# Patient Record
Sex: Male | Born: 1970 | Race: Black or African American | Hispanic: No | Smoking: Light tobacco smoker
Health system: Southern US, Community
[De-identification: ages and names within clinical notes are randomized; demographics above are authoritative.]

## PROBLEM LIST (undated history)

## (undated) ENCOUNTER — Ambulatory Visit: Admission: EM | Payer: Self-pay

## (undated) DIAGNOSIS — IMO0001 Reserved for inherently not codable concepts without codable children: Secondary | ICD-10-CM

## (undated) DIAGNOSIS — A630 Anogenital (venereal) warts: Secondary | ICD-10-CM

## (undated) DIAGNOSIS — R030 Elevated blood-pressure reading, without diagnosis of hypertension: Secondary | ICD-10-CM

## (undated) HISTORY — DX: Elevated blood-pressure reading, without diagnosis of hypertension: R03.0

## (undated) HISTORY — PX: HAND SURGERY: SHX662

## (undated) HISTORY — DX: Reserved for inherently not codable concepts without codable children: IMO0001

## (undated) HISTORY — PX: SURGERY SCROTAL / TESTICULAR: SUR1316

## (undated) HISTORY — DX: Anogenital (venereal) warts: A63.0

---

## 2002-09-10 ENCOUNTER — Emergency Department (HOSPITAL_COMMUNITY): Admission: EM | Admit: 2002-09-10 | Discharge: 2002-09-11 | Payer: Self-pay | Admitting: Emergency Medicine

## 2002-09-10 ENCOUNTER — Encounter: Payer: Self-pay | Admitting: Emergency Medicine

## 2002-09-18 ENCOUNTER — Ambulatory Visit (HOSPITAL_BASED_OUTPATIENT_CLINIC_OR_DEPARTMENT_OTHER): Admission: RE | Admit: 2002-09-18 | Discharge: 2002-09-18 | Payer: Self-pay | Admitting: Orthopedic Surgery

## 2002-12-15 ENCOUNTER — Emergency Department (HOSPITAL_COMMUNITY): Admission: EM | Admit: 2002-12-15 | Discharge: 2002-12-16 | Payer: Self-pay | Admitting: Emergency Medicine

## 2002-12-15 ENCOUNTER — Encounter: Payer: Self-pay | Admitting: Emergency Medicine

## 2003-09-09 ENCOUNTER — Emergency Department (HOSPITAL_COMMUNITY): Admission: EM | Admit: 2003-09-09 | Discharge: 2003-09-09 | Payer: Self-pay | Admitting: Emergency Medicine

## 2003-09-18 ENCOUNTER — Emergency Department (HOSPITAL_COMMUNITY): Admission: EM | Admit: 2003-09-18 | Discharge: 2003-09-18 | Payer: Self-pay | Admitting: Emergency Medicine

## 2004-06-20 ENCOUNTER — Emergency Department (HOSPITAL_COMMUNITY): Admission: EM | Admit: 2004-06-20 | Discharge: 2004-06-20 | Payer: Self-pay | Admitting: Family Medicine

## 2007-05-05 ENCOUNTER — Emergency Department (HOSPITAL_COMMUNITY): Admission: EM | Admit: 2007-05-05 | Discharge: 2007-05-06 | Payer: Self-pay | Admitting: Emergency Medicine

## 2007-08-27 ENCOUNTER — Ambulatory Visit: Payer: Self-pay | Admitting: Internal Medicine

## 2007-08-27 DIAGNOSIS — A630 Anogenital (venereal) warts: Secondary | ICD-10-CM

## 2007-11-10 ENCOUNTER — Emergency Department (HOSPITAL_COMMUNITY): Admission: EM | Admit: 2007-11-10 | Discharge: 2007-11-10 | Payer: Self-pay | Admitting: Emergency Medicine

## 2008-01-19 ENCOUNTER — Emergency Department (HOSPITAL_COMMUNITY): Admission: EM | Admit: 2008-01-19 | Discharge: 2008-01-19 | Payer: Self-pay | Admitting: Emergency Medicine

## 2009-03-25 ENCOUNTER — Emergency Department (HOSPITAL_COMMUNITY): Admission: EM | Admit: 2009-03-25 | Discharge: 2009-03-25 | Payer: Self-pay | Admitting: Emergency Medicine

## 2009-05-05 ENCOUNTER — Encounter: Payer: Self-pay | Admitting: Internal Medicine

## 2009-06-25 IMAGING — CR DG ABDOMEN ACUTE W/ 1V CHEST
3 series · 3 of 3 positions shown · non-contrast
Comparison: none

CLINICAL DATA: Abdominal pain.  Cramping.
 ACUTE ABDOMINAL SERIES:

[w chest pa]
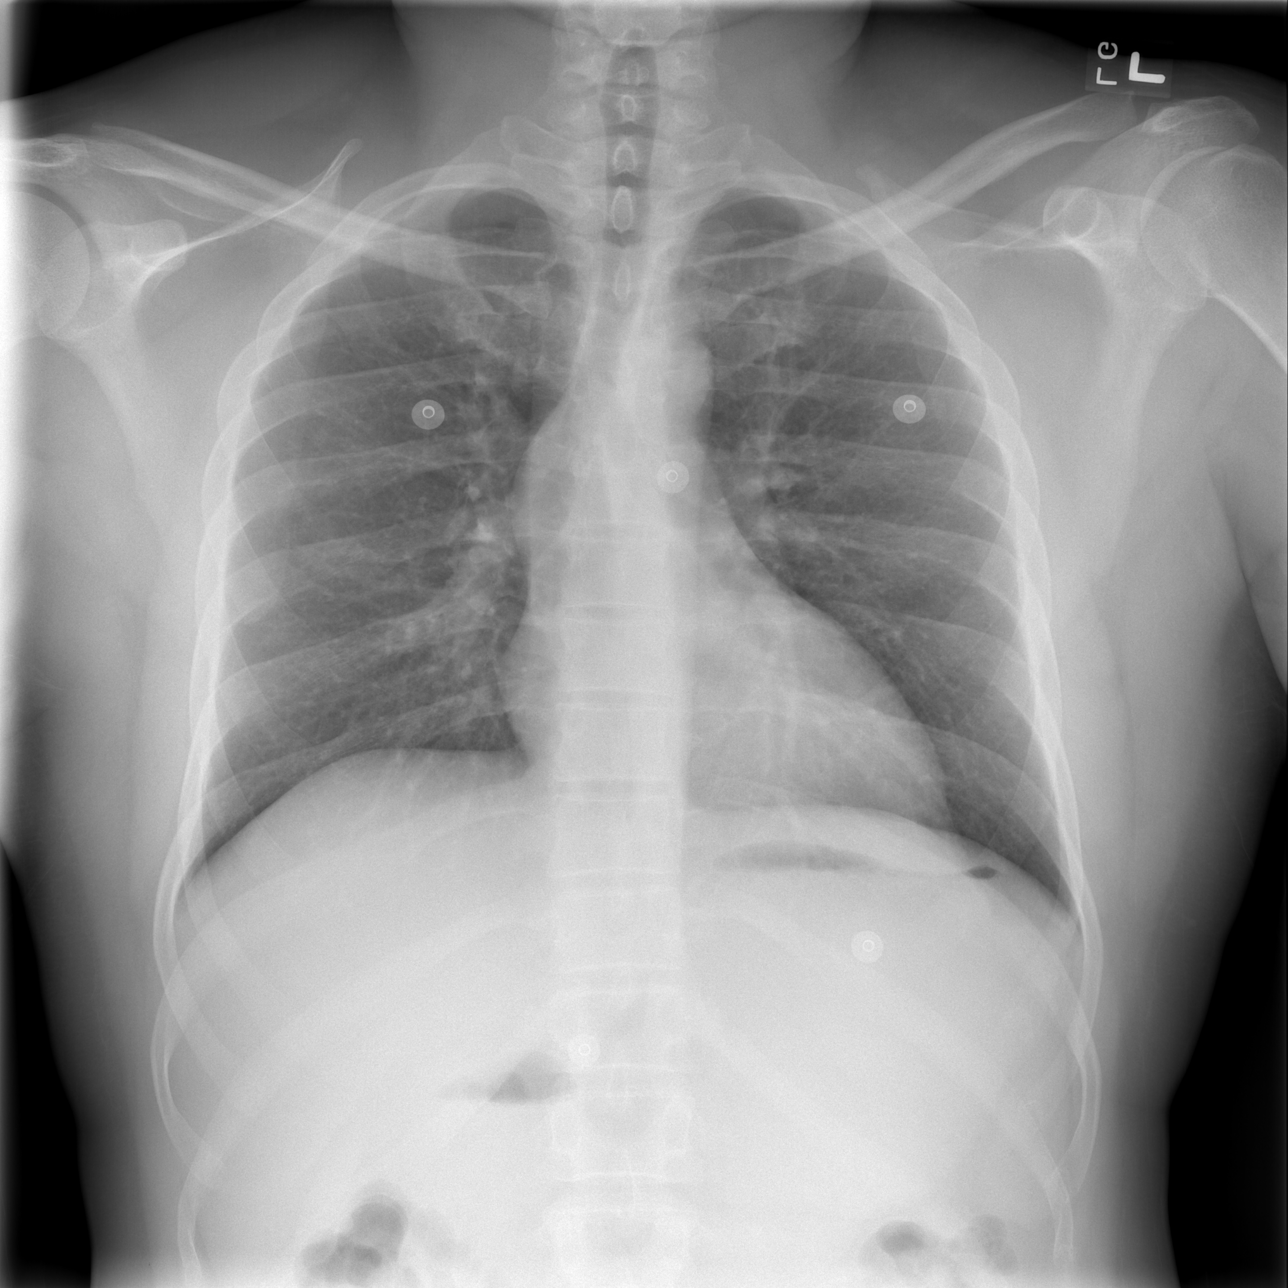

[w abdomen upright *]
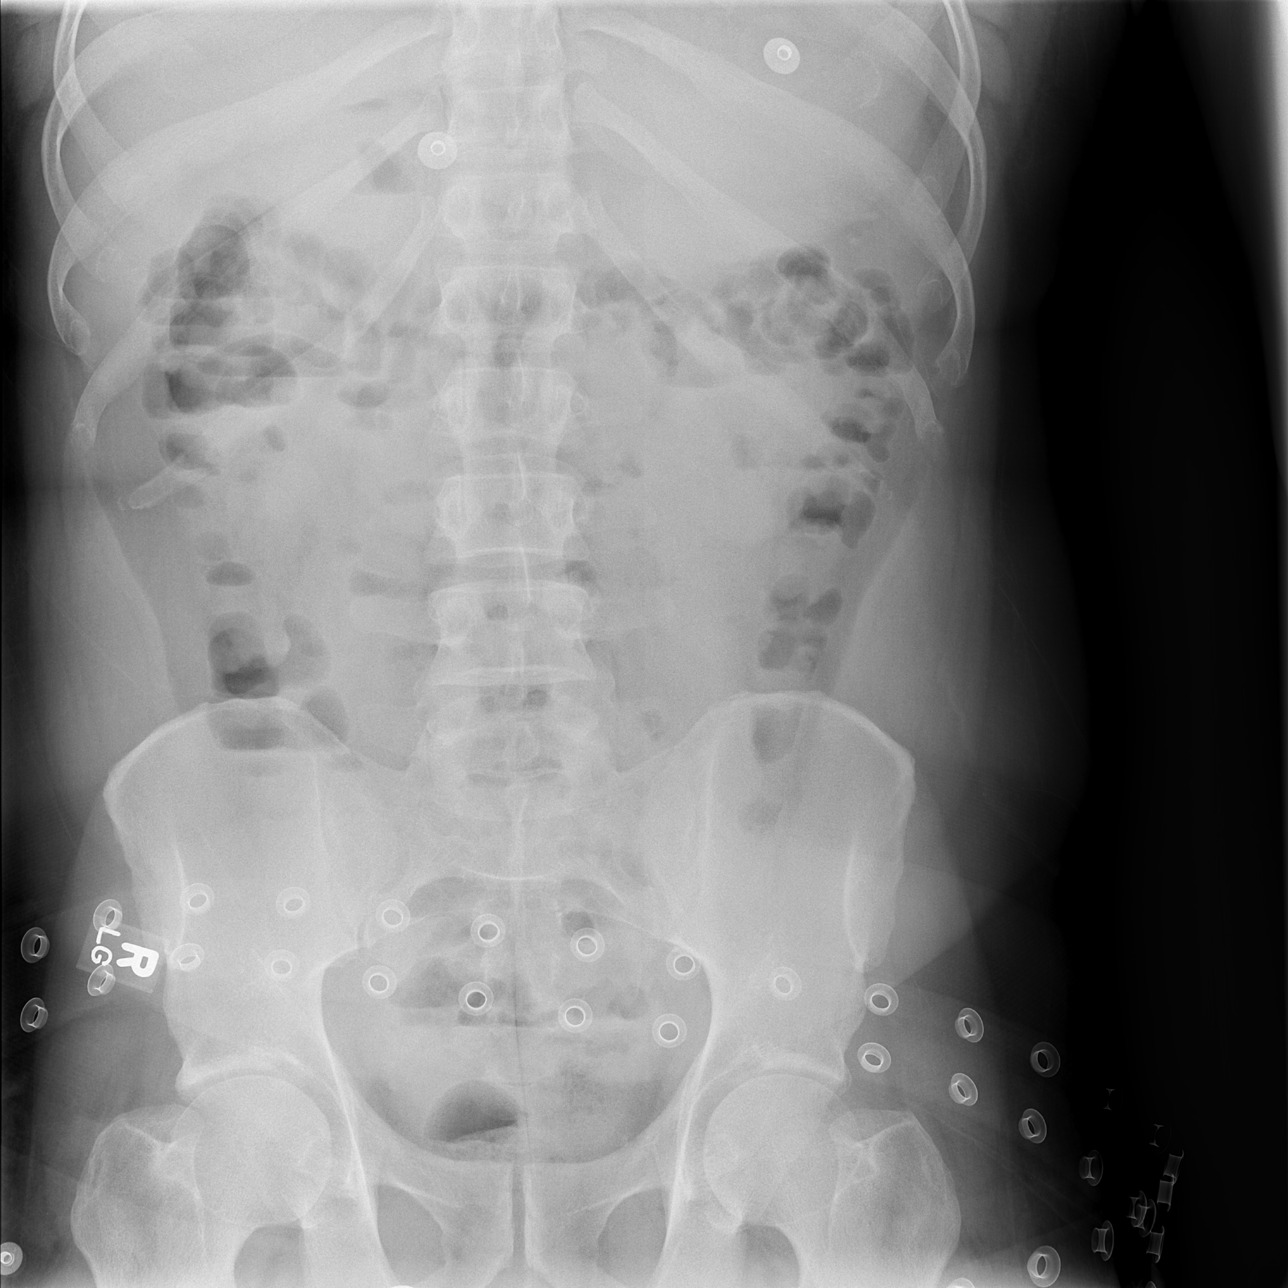

[t abdomen supine]
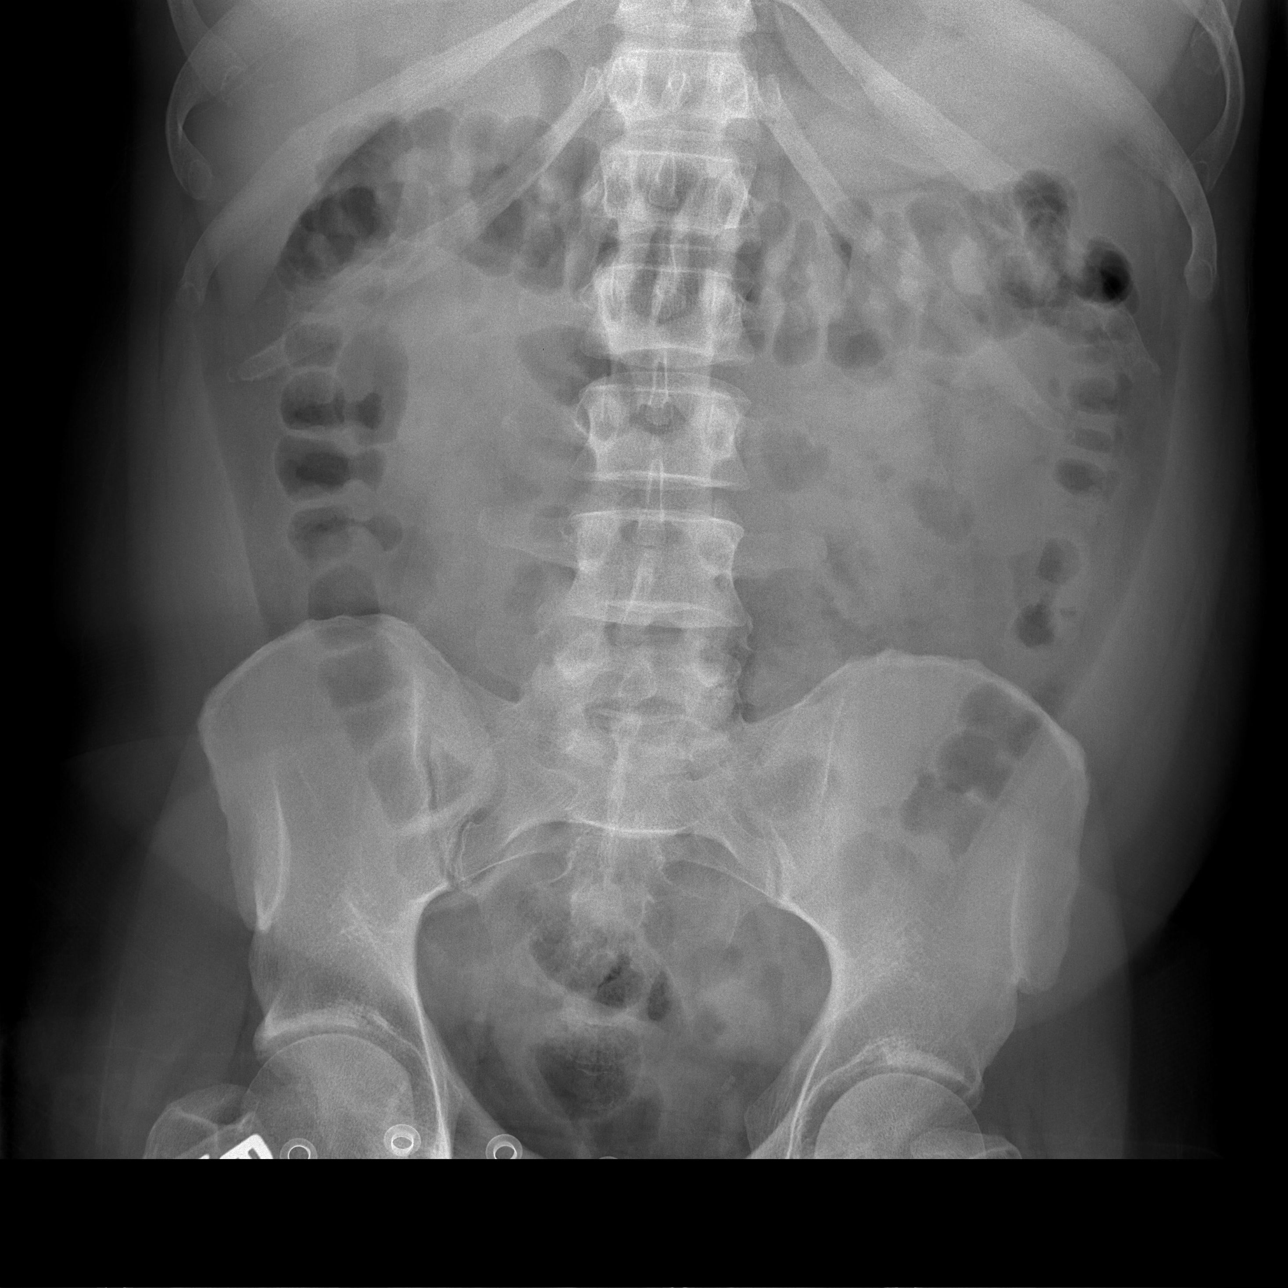

[3 of 3 positions shown; findings below may reference images not displayed]

FINDINGS: Scattered air fluid levels are seen within small bowel and colon.  There is no evidence of dilated bowel loops.  This is a nonspecific, nonobstructive bowel gas pattern.  There is no evidence of free intraperitoneal air.  No radiopaque calculi are identified. 
 Heart size and mediastinal contours are normal.  Both lungs are clear.
IMPRESSION: 1.  Nonspecific, nonobstructive bowel gas pattern.
 2.  No active cardiopulmonary disease.

## 2009-09-20 ENCOUNTER — Encounter: Payer: Self-pay | Admitting: Internal Medicine

## 2009-11-19 ENCOUNTER — Emergency Department (HOSPITAL_COMMUNITY): Admission: EM | Admit: 2009-11-19 | Discharge: 2009-11-19 | Payer: Self-pay | Admitting: Family Medicine

## 2010-04-04 ENCOUNTER — Encounter: Payer: Self-pay | Admitting: Internal Medicine

## 2010-06-21 NOTE — Letter (Signed)
Summary: [facility]  [facility]   Imported By: Maryln Gottron 04/22/2010 11:14:46  _____________________________________________________________________  External Attachment:    Type:   Image     Comment:   External Document

## 2010-06-21 NOTE — Letter (Signed)
Summary: [facility]  [facility]   Imported By: Maryln Gottron 10/19/2009 10:21:14  _____________________________________________________________________  External Attachment:    Type:   Image     Comment:   External Document

## 2010-06-21 NOTE — Consult Note (Signed)
Summary: [facility]  [facility]   Imported By: Maryln Gottron 06/04/2009 10:13:22  _____________________________________________________________________  External Attachment:    Type:   Image     Comment:   External Document

## 2010-07-11 ENCOUNTER — Emergency Department (HOSPITAL_COMMUNITY)
Admission: EM | Admit: 2010-07-11 | Discharge: 2010-07-11 | Disposition: A | Discharge status: Home / Self Care | Payer: BC Managed Care – PPO | Attending: Emergency Medicine | Admitting: Emergency Medicine

## 2010-07-11 ENCOUNTER — Inpatient Hospital Stay (INDEPENDENT_AMBULATORY_CARE_PROVIDER_SITE_OTHER)
Admission: RE | Admit: 2010-07-11 | Discharge: 2010-07-11 | Disposition: A | Discharge status: Home / Self Care | Payer: BC Managed Care – PPO | Source: Ambulatory Visit | Attending: Family Medicine | Admitting: Family Medicine

## 2010-07-11 DIAGNOSIS — H04309 Unspecified dacryocystitis of unspecified lacrimal passage: Secondary | ICD-10-CM | POA: Insufficient documentation

## 2010-07-11 DIAGNOSIS — R51 Headache: Secondary | ICD-10-CM | POA: Insufficient documentation

## 2010-07-11 DIAGNOSIS — R221 Localized swelling, mass and lump, neck: Secondary | ICD-10-CM

## 2010-07-11 DIAGNOSIS — R220 Localized swelling, mass and lump, head: Secondary | ICD-10-CM | POA: Insufficient documentation

## 2010-08-07 LAB — POCT URINALYSIS DIP (DEVICE)
Hgb urine dipstick: NEGATIVE
Ketones, ur: NEGATIVE mg/dL
Protein, ur: 30 mg/dL — AB
Specific Gravity, Urine: >=1.03 (ref 1.005–1.030)
pH: 5.5 (ref 5.0–8.0)

## 2010-10-07 NOTE — Op Note (Signed)
NAME:  Zachary Blake, Zachary Blake NO.:  [id number]   MEDICAL RECORD NO.:  [id number]                   PATIENT TYPE:  AMB   LOCATION:  DSC                                  FACILITY:  [facility]   PHYSICIAN:  Dionne Ano. Everlene Other, M.D.         DATE OF BIRTH:  Oct 13, 1970   DATE OF PROCEDURE:  09/18/2002  DATE OF DISCHARGE:                                 OPERATIVE REPORT   PREOPERATIVE DIAGNOSES:  1. Displaced fourth metacarpal fracture midshaft.  2. Fifth carpal metacarpal intra-articular fracture, right hand.   POSTOPERATIVE DIAGNOSES:  1. Displaced fourth metacarpal fracture midshaft.  2. Fifth carpal metacarpal intra-articular fracture, right hand.   OPERATION:  1. Open reduction and internal fixation with intramedullary fixation fourth     metacarpal fracture, right hand, midshaft in nature.  2. Open reduction and internal fixation fifth CMC fracture, right hand about     the base of the metacarpal.  3. Stress radiography.   SURGEON:  Dionne Ano. Amanda Pea, M.D.   ASSISTANT:  Karie Chimera, P.A.-C.   COMPLICATIONS:  None.   ANESTHESIA:  Regional block with IV sedation.   TOURNIQUET TIME:  Just over an hour.   ESTIMATED BLOOD LOSS:  Minimal.   DRAINS:  None   INDICATIONS FOR PROCEDURE:  This patient is a pleasant 40 year old male who  presents with the above mentioned diagnoses.  I have counseled him in  regards to the risks and benefits of surgery including risks of infection,  bleeding, anesthesia, damage to normal structures and failure of surgery to  accomplish its intended goals of relieving symptoms and restoring function.  With this in mind, he desires to proceed.  All questions have been  encouraged and answered preoperatively.   OPERATIVE FINDINGS:  The patient had significant displacement and  comminution at the fracture sites.  He underwent ORIF as described below  without difficulty.  He was stable at the conclusion of the case.  He  did  have some comminution at the [facility] joint; however, the ulnar pillar was intact  in terms of its stability at the conclusion of the case and I was pleased  with this.  The comminution was primarily about the most radial portion of  the base of the fifth CMC region.   DESCRIPTION OF PROCEDURE:  The patient was seen by myself and anesthesia.  He was taken to the operative suite and underwent smooth induction of IV  sedation and regional block was administered in preoperative holding without  difficulty.  Following this, he was prepped and draped in the usual sterile  fashion.  After which tourniquet was applied.  Once this was done, sterile  field was secured.  Arm was elevated and the tourniquet was inflated to 250  mmHg.  Incision made, oblique in nature, overlying the fourth and fifth CMC  joints.  Dissection was carried down, taking care to preserve the dorsal  sensory branch of the  ulnar nerve.  Dissection was carried down to the  fourth metacarpal base.  A hole was made with 2.5 mm drill and following  this a blunt end 0.062 K-wire was threaded in medullary through the shaft.  The patient had a difficult fracture pattern, thus the decision was made  after attempts at closed reduction to make a small incision over the  fracture site.  This incision was carried out through the transverse knife  blade cut and the incision was carried down bluntly to the extensor  apparatus which was swept out of harm's way.  The fracture site was then  identified and I then reduced the fracture after irrigating it.  This was  reduced and intramedullary fixation device (blunt end K-wire) was passed  through the medullary canal.  This seated nicely.  I pulled it back  somewhat, bent the wire 90 degrees at the base and then engaged it distally.  This had perfect anatomic reduction and I was very pleased with this.  Following this, attention was turned towards the fifth CMC joint.  I opened  up the  capsule.  After blunt dissection down to the fifth CMC joint, the  radial aspect of the fifth metacarpal had a significant comminution and  displaced.  I placed this back into place with elevator and then with  longitudinal traction pinned the fracture site.  The ulnar portion of the  metacarpal looked excellent and sat well against the outer border of the  hamate.  I made sure the pins were dorsal and not volar, so as not to  impinge against the deep motor branch to the ulnar nerve.  The patient  tolerated this well.  Following this, I checked finger splay, which looked  excellent.  Deflated the tourniquet.  Obtained hemostasis and closed the  wounds after stress radiography was performed and x-rays revealed adequate  position of the fractures and hardware.  The patient tolerated this well.  He was awakened from light IV sedation and taken to the recovery room where  he was noted to be in stable condition.  He will be monitored in the  recovery room and then discharged home.  I am going to give him a gram of  additional Ancef in the recovery room and discharged him on Keflex as well  as appropriate pain management.  All questions have been encouraged and  answered.                                               Dionne Ano. Everlene Other, M.D.    Nash Mantis  D:  09/18/2002  T:  09/19/2002  Job:  [id number]

## 2011-02-24 LAB — CBC
HCT: 55 — ABNORMAL HIGH
Hemoglobin: 18.7 — ABNORMAL HIGH
MCHC: 34
MCV: 79.7
Platelets: 229
RDW: 14.7

## 2011-02-24 LAB — COMPREHENSIVE METABOLIC PANEL
BUN: 14
CO2: 23
Calcium: 9.4
Chloride: 104
Creatinine, Ser: 0.98
GFR calc non Af Amer: >60
Glucose, Bld: 135 — ABNORMAL HIGH
Total Bilirubin: 1

## 2011-02-24 LAB — DIFFERENTIAL
Basophils Absolute: 0
Eosinophils Relative: 1
Lymphocytes Relative: 5 — ABNORMAL LOW
Neutro Abs: 15.2 — ABNORMAL HIGH
Neutrophils Relative %: 92 — ABNORMAL HIGH

## 2011-02-24 LAB — LIPASE, BLOOD: Lipase: 20

## 2011-02-27 LAB — URINALYSIS, ROUTINE W REFLEX MICROSCOPIC
Leukocytes, UA: NEGATIVE
Nitrite: NEGATIVE
Specific Gravity, Urine: 1.036 — ABNORMAL HIGH
Urobilinogen, UA: 0.2

## 2011-04-29 ENCOUNTER — Emergency Department (HOSPITAL_COMMUNITY)
Admission: EM | Admit: 2011-04-29 | Discharge: 2011-04-29 | Disposition: A | Discharge status: Home / Self Care | Payer: BC Managed Care – PPO | Source: Home / Self Care | Attending: Family Medicine | Admitting: Family Medicine

## 2011-04-29 ENCOUNTER — Encounter: Payer: Self-pay | Admitting: Emergency Medicine

## 2011-04-29 DIAGNOSIS — S29012A Strain of muscle and tendon of back wall of thorax, initial encounter: Secondary | ICD-10-CM

## 2011-04-29 DIAGNOSIS — S239XXA Sprain of unspecified parts of thorax, initial encounter: Secondary | ICD-10-CM

## 2011-04-29 MED ORDER — KETOROLAC TROMETHAMINE 30 MG/ML IJ SOLN
INTRAMUSCULAR | Status: AC
  Filled 2011-04-29: qty 1

## 2011-04-29 MED ORDER — CYCLOBENZAPRINE HCL 5 MG PO TABS: 5.0000 mg | ORAL_TABLET | Freq: Three times a day (TID) | ORAL | Status: AC | PRN

## 2011-04-29 MED ORDER — KETOROLAC TROMETHAMINE 30 MG/ML IJ SOLN
30.0000 mg | Freq: Once | INTRAMUSCULAR | Status: AC
  Administered 2011-04-29: 30 mg via INTRAMUSCULAR

## 2011-04-29 MED ORDER — IBUPROFEN 800 MG PO TABS: 800.0000 mg | ORAL_TABLET | Freq: Three times a day (TID) | ORAL | Status: AC

## 2011-04-29 NOTE — ED Provider Notes (Signed)
History     CSN: [id number] Arrival date & time: 04/29/2011 10:03 AM   First MD Initiated Contact with Patient 04/29/11 [phone]      Chief Complaint  Patient presents with  . Back Pain    3 weeks ago started having pain in neck and shoulders (on left).  over the 3 weeks has worsened in intensity and is shooting to mid left back, now described as spasms, cramping, worse at night.  denies urinary symptoms.  last bm was last nightand normal per patient .  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      (Consider location/radiation/quality/duration/timing/severity/associated sxs/prior treatment) Patient is a 40 y.o. male presenting with back pain.  Back Pain  This is a new problem. The current episode started more than 1 week ago. The problem occurs constantly. The problem has been gradually improving. The pain is associated with lifting heavy objects. The pain is present in the thoracic spine. The quality of the pain is described as shooting. The pain does not radiate. The pain is mild. The symptoms are aggravated by certain positions (better when child stands on back.). He has tried NSAIDs for the symptoms. The treatment provided mild relief.    History reviewed. No pertinent past medical history.  History reviewed. No pertinent past surgical history.  No family history on file.  History  Substance Use Topics  . Smoking status: Current Everyday Smoker  . Smokeless tobacco: Not on file  . Alcohol Use: Yes      Review of Systems  Constitutional: Negative.   HENT: Negative.   Eyes: Negative.   Respiratory: Negative.   Gastrointestinal: Negative.   Genitourinary: Negative.   Musculoskeletal: Positive for myalgias and back pain.    Allergies  Review of patient's allergies indicates no known allergies.  Home Medications   Current Outpatient Rx  Name Route Sig Dispense Refill  . IBUPROFEN 800 MG PO TABS Oral Take 800 mg by mouth every 8 (eight) hours  as needed.      . CYCLOBENZAPRINE HCL 5 MG PO TABS Oral Take 1 tablet (5 mg total) by mouth 3 (three) times daily as needed for muscle spasms. 30 tablet 0  . IBUPROFEN 800 MG PO TABS Oral Take 1 tablet (800 mg total) by mouth 3 (three) times daily. 30 tablet 1    BP 128/83  Pulse 70  Temp(Src) 97.7 F (36.5 C) (Oral)  Resp 17  SpO2 98%  Physical Exam  Nursing note and vitals reviewed. Constitutional: He appears well-developed and well-nourished.  Pulmonary/Chest: Effort normal and breath sounds normal.  Abdominal: Soft. Bowel sounds are normal.  Musculoskeletal: Normal range of motion. He exhibits tenderness.       Arms: Neurological: He is alert.    ED Course  Procedures (including critical care time)  Labs Reviewed - No data to display No results found.   1. Strain of mid-back       MDM          Barkley Bruns, MD 04/29/11 [phone]

## 2011-04-29 NOTE — ED Notes (Signed)
As above.

## 2011-05-25 ENCOUNTER — Encounter: Payer: Self-pay | Admitting: Internal Medicine

## 2011-05-25 ENCOUNTER — Ambulatory Visit (INDEPENDENT_AMBULATORY_CARE_PROVIDER_SITE_OTHER): Payer: BC Managed Care – PPO | Admitting: Internal Medicine

## 2011-05-25 VITALS — BP 132/92 | HR 87 | Temp 97.7°F | Ht 69.0 in | Wt 204.4 lb

## 2011-05-25 DIAGNOSIS — N5089 Other specified disorders of the male genital organs: Secondary | ICD-10-CM | POA: Insufficient documentation

## 2011-05-25 DIAGNOSIS — R030 Elevated blood-pressure reading, without diagnosis of hypertension: Secondary | ICD-10-CM | POA: Insufficient documentation

## 2011-05-25 DIAGNOSIS — A630 Anogenital (venereal) warts: Secondary | ICD-10-CM

## 2011-05-25 DIAGNOSIS — N508 Other specified disorders of male genital organs: Secondary | ICD-10-CM

## 2011-05-25 MED ORDER — IMIQUIMOD 5 % EX CREA: TOPICAL_CREAM | CUTANEOUS | Status: AC

## 2011-05-25 NOTE — Assessment & Plan Note (Signed)
[employer]

## 2011-05-25 NOTE — Assessment & Plan Note (Addendum)
Discussed low salt diet, exercise. Recheck on return to the office

## 2011-05-25 NOTE — Progress Notes (Signed)
  Subjective:    Patient ID: Zachary Blake, male    DOB: [birth year], 41 y.o.   MRN: [id number]  HPI New patient, and to get established. Has a history of mildly elevated BP, on lifestyle modification. Ambulatory BPs around 130/90. also 1.5 year history of genital warts, at some point saw a physician, was prescribed a medication which helped temporarily. Lesion is back.  Past medical history Elevated BP Genital warts  Past surgical history Left testicular surgery as a child for ? (Undescended testicle?) R hand surgery for Fx   Social history Divorced, 2 children live with him Tobacco-- occasionally ETOH-- socially Job-- Dentist , doors, etc  Family history Diabetes--no CAD--no Stroke--no lung Ca-- GM Colon cancer--no Prostate cancer--no   Review of Systems Denies any dysuria or gross hematuria. Besides warts, no other  genital lesions. No previous history for STDs. he uses a condom consistently    Objective:   Physical Exam  Constitutional: He is oriented to person, place, and time. He appears well-developed and well-nourished.  HENT:  Head: Normocephalic.  Cardiovascular: Normal rate, regular rhythm and normal heart sounds.   No murmur heard. Pulmonary/Chest: Effort normal and breath sounds normal. No respiratory distress. He has no wheezes. He has no rales.  Genitourinary:          Penis is free of discharge or lesions. Scrotum: Left testicle atrophic Right testicle seems normal to palpation, he does have a 1/3 cm soft mass, external from the testicle and posteriorly  Musculoskeletal: He exhibits no edema.  Neurological: He is alert and oriented to person, place, and time.          Assessment & Plan:

## 2011-05-25 NOTE — Patient Instructions (Addendum)
Apply Aldara 3 times a week at bedtime, leave in the skin for 6-10 hours. Use it for 4 months. Check the  blood pressure 2 or 3 times a week, be sure it is less than 140/85. If it is consistently higher, let me know

## 2011-05-25 NOTE — Assessment & Plan Note (Signed)
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 Plan: Ultrasound

## 2011-06-04 ENCOUNTER — Telehealth: Payer: Self-pay | Admitting: Internal Medicine

## 2011-06-04 NOTE — Telephone Encounter (Signed)
HIV RPR ordered, apparently not done. Please schedule labs

## 2011-06-05 NOTE — Telephone Encounter (Signed)
Left message to call office

## 2011-06-06 NOTE — Telephone Encounter (Signed)
Left message to call office

## 2011-06-07 NOTE — Telephone Encounter (Signed)
Left message to call office

## 2011-06-09 ENCOUNTER — Encounter: Payer: Self-pay | Admitting: *Deleted

## 2011-06-09 NOTE — Telephone Encounter (Signed)
Left message to call office, Letter Mail after several attempts to contact Pt. 

## 2011-06-15 ENCOUNTER — Encounter: Payer: Self-pay | Admitting: Internal Medicine

## 2011-06-22 ENCOUNTER — Telehealth: Payer: Self-pay | Admitting: Internal Medicine

## 2011-06-22 NOTE — Telephone Encounter (Signed)
The order for this patient to have a Scrotal u/s was entered on 05-25-11.  [facility] has attempted to contact the patient, I have tried to reach him multiple times, and I mailed him a letter over a week ago.  This patient will not respond to calls or letter.

## 2011-06-22 NOTE — Telephone Encounter (Signed)
I think we are doing all we can. Please send a 2nd and final  letter

## 2011-07-03 ENCOUNTER — Encounter: Payer: Self-pay | Admitting: Internal Medicine

## 2011-07-03 NOTE — Telephone Encounter (Signed)
Final letter mailed to patient.

## 2011-07-05 ENCOUNTER — Encounter: Payer: BC Managed Care – PPO | Admitting: Internal Medicine

## 2011-10-18 ENCOUNTER — Encounter (HOSPITAL_COMMUNITY): Payer: Self-pay

## 2011-10-18 ENCOUNTER — Emergency Department (HOSPITAL_COMMUNITY)
Admission: EM | Admit: 2011-10-18 | Discharge: 2011-10-18 | Disposition: A | Discharge status: Home Health | Payer: BC Managed Care – PPO | Source: Home / Self Care | Attending: Emergency Medicine | Admitting: Emergency Medicine

## 2011-10-18 ENCOUNTER — Emergency Department (INDEPENDENT_AMBULATORY_CARE_PROVIDER_SITE_OTHER): Payer: BC Managed Care – PPO

## 2011-10-18 DIAGNOSIS — S61209A Unspecified open wound of unspecified finger without damage to nail, initial encounter: Secondary | ICD-10-CM

## 2011-10-18 DIAGNOSIS — Z23 Encounter for immunization: Secondary | ICD-10-CM

## 2011-10-18 DIAGNOSIS — S61011A Laceration without foreign body of right thumb without damage to nail, initial encounter: Secondary | ICD-10-CM

## 2011-10-18 MED ORDER — TETANUS-DIPHTH-ACELL PERTUSSIS 5-2.5-18.5 LF-MCG/0.5 IM SUSP
0.5000 mL | Freq: Once | INTRAMUSCULAR | Status: AC
  Administered 2011-10-18: 0.5 mL via INTRAMUSCULAR

## 2011-10-18 MED ORDER — TRAMADOL HCL 50 MG PO TABS: 100.0000 mg | ORAL_TABLET | Freq: Three times a day (TID) | ORAL | Status: AC | PRN

## 2011-10-18 MED ORDER — TETANUS-DIPHTH-ACELL PERTUSSIS 5-2.5-18.5 LF-MCG/0.5 IM SUSP
INTRAMUSCULAR | Status: AC
  Filled 2011-10-18: qty 0.5

## 2011-10-18 NOTE — ED Notes (Signed)
Pt c/o injury to R thumb.  Pt states he shut thumb in car door approx 1 hour ago.  Pt has appox 3cm lac to thumb, bleeding controlled upon arrival.  Tetanus unknown.

## 2011-10-18 NOTE — Discharge Instructions (Signed)

## 2011-10-18 NOTE — ED Provider Notes (Signed)
Chief Complaint  Patient presents with  . Finger Injury    History of Present Illness:  The patient is a 41 year old male who lacerated his right thumb this evening. He shut the thumb up in the sliding door of a van. He cannot recall when his last tetanus shot was. The tip of the thumb is painful. He is able to flex the interphalangeal joint. There is no numbness or tingling.  Review of Systems:  Other than noted above, the patient denies any of the following symptoms: Systemic:  No fever or chills. Musculoskeletal:  No joint pain or decreased range of motion. Neuro:  No numbness, tingling, or weakness.  PMFSH:  Past medical history, family history, social history, meds, and allergies were reviewed.  Physical Exam:   Vital signs:  BP 133/89  Pulse 77  Temp(Src) 98.8 F (37.1 C) (Oral)  Resp 19  SpO2 100% Ext:  There is a 1.5 cm laceration across the tip of the thumb. This is fairly shallow. The tip of the thumb is tender to palpation. There is no obvious deformity. He is able to flex and extend the interphalangeal joint and the MCP joint. Sensation is intact.  All joints had a full ROM without pain.  Pulses were full.  Good capillary refill in all digits.  No edema. Neurological:  Alert and oriented.  No muscle weakness.  Sensation was intact to light touch.   Procedure: Verbal informed consent was obtained.  The patient was informed of the risks and benefits of the procedure and understands and accepts.  Identity of the patient was verified verbally and by wristband.   The laceration area described above was prepped with both a Betadine soak and Betadine prep and anesthetized with a digital block with 5 mL of 2% Xylocaine without epinephrine.  The wound was then closed as follows:  Skin was closed with 5 5-0 nylon sutures.  There were no immediate complications, and the patient tolerated the procedure well. The laceration was then cleansed, Bacitracin ointment was applied and a clean, dry  pressure dressing was put on.   Medications given in UCC:  He was given a Tdap vaccine and tolerated this well without any immediate side effects.  Assessment:  The encounter diagnosis was Laceration of right thumb.  Plan:   1.  The following meds were prescribed:   New Prescriptions   TRAMADOL (ULTRAM) 50 MG TABLET    Take 2 tablets (100 mg total) by mouth every 8 (eight) hours as needed for pain.   2.  The patient was instructed in wound care and pain control, and handouts were given. 3.  The patient was told to return in 14 days for suture removal or wound recheck or sooner if any sign of infection.    Reuben Likes, MD 10/18/11 [phone]

## 2013-09-30 ENCOUNTER — Telehealth: Payer: Self-pay

## 2013-09-30 NOTE — Telephone Encounter (Signed)
Left message for call back Non identifiable  

## 2013-10-01 ENCOUNTER — Ambulatory Visit (INDEPENDENT_AMBULATORY_CARE_PROVIDER_SITE_OTHER): Payer: BC Managed Care – PPO | Admitting: Internal Medicine

## 2013-10-01 ENCOUNTER — Encounter: Payer: Self-pay | Admitting: Internal Medicine

## 2013-10-01 VITALS — BP 125/82 | HR 68 | Temp 98.2°F | Ht 70.0 in | Wt 188.0 lb

## 2013-10-01 DIAGNOSIS — L723 Sebaceous cyst: Secondary | ICD-10-CM

## 2013-10-01 DIAGNOSIS — IMO0002 Reserved for concepts with insufficient information to code with codable children: Secondary | ICD-10-CM

## 2013-10-01 MED ORDER — CYCLOBENZAPRINE HCL 10 MG PO TABS: 10.0000 mg | ORAL_TABLET | Freq: Every evening | ORAL | Status: DC | PRN

## 2013-10-01 NOTE — Progress Notes (Signed)
Pre visit review using our clinic review tool, if applicable. No additional management support is needed unless otherwise documented below in the visit note. 

## 2013-10-01 NOTE — Progress Notes (Signed)
   Subjective:    Patient ID: Zachary Blake, male    DOB: 23-Jan-1971, 43 y.o.   MRN: [id number]  DOS:  10/01/2013 Type of  visit: Here to discuss the following issues Yesterday he was picking up some mail, nothing heavy, he bend and  while he was bending back up developed pain on the left mid thoracic area, no radiation. Has been taking some Advil without much relief. The pain is steady, denies any paresthesias in the chest or abdomen.   Also, 5 months history noted a lump on the left forehead.. Patient quite concerned about it. Denies pain or discharge.   ROS Denies fever, chills. No cough, difficulty breathing or recent prolonged trips. No dysuria or gross hematuria.   Past Medical History  Diagnosis Date  . Elevated BP as a child  . Warts, genital     Past Surgical History  Procedure Laterality Date  . 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 Hand surgery Right     fracture    History   Social History  . Marital Status: Single    Spouse Name: N/A    Number of Children: 2  . Years of Education: N/A   Occupational History  . professional Advertising account plannerinstaler , doors, etc    Social History Main Topics  . Smoking status: Current Every Day Smoker  . Smokeless tobacco: Never Used     Comment: occasionally  . Alcohol Use: Yes     Comment: occasionally  . Drug Use: No  . Sexual Activity: Not on file   Other Topics Concern  . Not on file   Social History Narrative   2 children live w/  him        Medication List       This list is accurate as of: 10/01/13  8:26 PM.  Always use your most recent med list.               cyclobenzaprine 10 MG tablet  Commonly known as:  FLEXERIL  Take 1 tablet (10 mg total) by mouth at bedtime as needed for muscle spasms.           Objective:   Physical Exam  HENT:  Head:     BP 125/82  Pulse 68  Temp(Src) 98.2 F (36.8 C)  Ht 5\' 10"  (1.778 m)  Wt  188 lb (85.276 kg)  BMI 26.98 kg/m2  SpO2 98%  General -- alert, well-developed, NAD.  Lungs -- normal respiratory effort, no intercostal retractions, no accessory muscle use, and normal breath sounds.  Heart-- normal rate, regular rhythm, no murmur.  Skin-- no rash at torso Extremities-- no pretibial edema bilaterally  Back-- no TTP @ the T spine Neurologic--  alert & oriented X3. Speech normal, gait normal, strength normal in all extremities.  DTRs symmetric  Psych-- Cognition and judgment appear intact. Cooperative with normal attention span and concentration. No anxious or depressed appearing.         Assessment & Plan:   Sprain, new issue  Thoracic back sprain, no red flag symptoms, no rash noted today. Conservative treatment with NSAIDs, GI precautions discussed Heating pad Flexeril. If not better he will need to be reassessed  Cyst, new issue  Forehead mass  likely a sebaceous cyst, he does like it removed-- refer to surgery

## 2013-10-01 NOTE — Patient Instructions (Signed)
Motrin 200 mg 2 tablets every 6 hours as needed for pain. Always take it with food because may cause gastritis and ulcers. If you notice nausea, stomach pain, change in the color of stools --->  Stop the medicine and let us know  Flexeril, a muscle relaxant: one time at bedtime as needed, will cause drowsiness  Heating pad  If not improving in the next 2 weeks please let us know.  Come back at your convenience for a physical exam (fasting)

## 2013-10-02 ENCOUNTER — Telehealth: Payer: Self-pay | Admitting: Internal Medicine

## 2013-10-02 NOTE — Telephone Encounter (Signed)
Relevant patient education mailed to patient.  

## 2013-10-03 NOTE — Telephone Encounter (Signed)
Unable to reach prior to visit  

## 2013-10-14 ENCOUNTER — Ambulatory Visit (INDEPENDENT_AMBULATORY_CARE_PROVIDER_SITE_OTHER): Payer: BC Managed Care – PPO | Admitting: Surgery

## 2014-03-22 ENCOUNTER — Emergency Department (HOSPITAL_COMMUNITY)
Admission: EM | Admit: 2014-03-22 | Discharge: 2014-03-22 | Disposition: A | Discharge status: Home / Self Care | Payer: BC Managed Care – PPO | Source: Home / Self Care | Attending: Emergency Medicine | Admitting: Emergency Medicine

## 2014-03-22 ENCOUNTER — Encounter (HOSPITAL_COMMUNITY): Payer: Self-pay | Admitting: Emergency Medicine

## 2014-03-22 DIAGNOSIS — J0120 Acute ethmoidal sinusitis, unspecified: Secondary | ICD-10-CM

## 2014-03-22 MED ORDER — HYDROCODONE-ACETAMINOPHEN 5-325 MG PO TABS: 1.0000 | ORAL_TABLET | Freq: Four times a day (QID) | ORAL | Status: DC | PRN

## 2014-03-22 MED ORDER — AMOXICILLIN-POT CLAVULANATE 875-125 MG PO TABS: 1.0000 | ORAL_TABLET | Freq: Two times a day (BID) | ORAL | Status: DC

## 2014-03-22 NOTE — ED Provider Notes (Signed)
CSN: [id number]     Arrival date & time 03/22/14  1658 History   First MD Initiated Contact with Patient 03/22/14 1710     Chief Complaint  Patient presents with  . Facial Pain   (Consider location/radiation/quality/duration/timing/severity/associated sxs/prior Treatment) HPI He is a 43 year old man here for no swelling. He states that on Tuesday, he fell asleep with his glasses on. Wednesday morning he woke up with pain and swelling on the left nasal bridge. He is having a little more difficulty breathing out of the left nostril.He denies any pain with eye movement, headaches, fevers, chills. It has gradually gotten worse. He took a friend's pain pill last night which did help significantly with the pain.  Past Medical History  Diagnosis Date  . Elevated BP as a child  . Warts, genital    Past Surgical History  Procedure Laterality Date  . 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 Hand surgery Right     fracture   Family History  Problem Relation Age of Onset  . CAD Neg Hx   . Diabetes Neg Hx   . Colon cancer Neg Hx   . Prostate cancer Neg Hx    History  Substance Use Topics  . Smoking status: Light Tobacco Smoker  . Smokeless tobacco: Never Used     Comment: occasionally  . Alcohol Use: Yes     Comment: occasionally    Review of Systems  Constitutional: Negative for fever.  HENT:       Nasal pain and swelling  Eyes: Negative for pain and redness.  Respiratory: Negative for shortness of breath.     Allergies  Review of patient's allergies indicates no known allergies.  Home Medications   Prior to Admission medications   Medication Sig Start Date End Date Taking? Authorizing Provider  amoxicillin-clavulanate (AUGMENTIN) 875-125 MG per tablet Take 1 tablet by mouth 2 (two) times daily. 03/22/14   Charm RingsErin J Catalaya Garr, MD  cyclobenzaprine (FLEXERIL) 10 MG tablet Take 1 tablet (10 mg total) by mouth at bedtime  as needed for muscle spasms. 10/01/13   Wanda PlumpJose E Paz, MD  HYDROcodone-acetaminophen (NORCO) 5-325 MG per tablet Take 1 tablet by mouth every 6 (six) hours as needed for moderate pain. 03/22/14   Charm RingsErin J Marija Calamari, MD   BP 115/77 mmHg  Pulse 92  Temp(Src) 98.5 F (36.9 C) (Oral)  Resp 16  SpO2 99% Physical Exam  Constitutional: He is oriented to person, place, and time. He appears well-developed and well-nourished. No distress.  HENT:  Head: Normocephalic and atraumatic.    Nose: Mucosal edema (on left with nasal polyp) present. No nasal deformity or nasal septal hematoma. Right sinus exhibits no maxillary sinus tenderness and no frontal sinus tenderness. Left sinus exhibits no maxillary sinus tenderness and no frontal sinus tenderness.  Eyes: Conjunctivae and EOM are normal. Right eye exhibits no discharge. Left eye exhibits no discharge.  Neurological: He is alert and oriented to person, place, and time.    ED Course  Procedures (including critical care time) Labs Review Labs Reviewed - No data to display  Imaging Review No results found.   MDM   1. Acute ethmoidal sinusitis, recurrence not specified    Will treat for acute sinusitis with Augmentin for 10 days. Prescription for Norco provided to use as needed for severe pain. Recommended ibuprofen 800 mg 3 times a day for pain and swelling. Follow-up with  PCP if no improvement in 48 hours.    Charm RingsErin J Eman Morimoto, MD 03/22/14 [phone]

## 2014-03-22 NOTE — Discharge Instructions (Signed)
We are going to treat you for a sinus infection. Take Augmentin 2 times a day for 10 days. Take ibuprofen 800mg  every 8 hours as needed. Use norco every 4-6 hours as needed for severe pain.  Do not drive or work while on his medication.  Follow up with PCP if no improvement in 48 hours.

## 2014-03-22 NOTE — ED Notes (Signed)
43 year old male states that about a week ago he fell asleep while wearing his glasses and now he has swelling and  Facial pain across  The bridge of his nose.

## 2015-02-12 ENCOUNTER — Ambulatory Visit (INDEPENDENT_AMBULATORY_CARE_PROVIDER_SITE_OTHER): Payer: 59 | Admitting: Medical

## 2015-02-12 ENCOUNTER — Encounter: Payer: Self-pay | Admitting: Medical

## 2015-02-12 VITALS — BP 120/70 | HR 71 | Temp 97.7°F | Ht 69.0 in | Wt 204.2 lb

## 2015-02-12 DIAGNOSIS — R42 Dizziness and giddiness: Secondary | ICD-10-CM | POA: Diagnosis not present

## 2015-02-12 DIAGNOSIS — R739 Hyperglycemia, unspecified: Secondary | ICD-10-CM | POA: Diagnosis not present

## 2015-02-12 DIAGNOSIS — H6500 Acute serous otitis media, unspecified ear: Secondary | ICD-10-CM | POA: Diagnosis not present

## 2015-02-12 LAB — CBC WITH DIFFERENTIAL/PLATELET
BASOS ABS: 0.1 10*3/uL (ref 0.0–0.1)
Basophils Relative: 1 % (ref 0–1)
Eosinophils Absolute: 0.2 10*3/uL (ref 0.0–0.7)
Eosinophils Relative: 2 % (ref 0–5)
HEMATOCRIT: 46.3 % (ref 39.0–52.0)
HEMOGLOBIN: 16.6 g/dL (ref 13.0–17.0)
LYMPHS ABS: 4.4 10*3/uL — AB (ref 0.7–4.0)
LYMPHS PCT: 40 % (ref 12–46)
MCH: 27.6 pg (ref 26.0–34.0)
MCHC: 35.9 g/dL (ref 30.0–36.0)
MCV: 77 fL — AB (ref 78.0–100.0)
MPV: 9.2 fL (ref 8.6–12.4)
Monocytes Absolute: 0.7 10*3/uL (ref 0.1–1.0)
Monocytes Relative: 6 % (ref 3–12)
NEUTROS PCT: 51 % (ref 43–77)
Neutro Abs: 5.6 10*3/uL (ref 1.7–7.7)
Platelets: 203 10*3/uL (ref 150–400)
RBC: 6.01 MIL/uL — ABNORMAL HIGH (ref 4.22–5.81)
RDW: 15.6 % — ABNORMAL HIGH (ref 11.5–15.5)
WBC: 11 10*3/uL — ABNORMAL HIGH (ref 4.0–10.5)

## 2015-02-12 LAB — COMPREHENSIVE METABOLIC PANEL
ALBUMIN: 4 g/dL (ref 3.6–5.1)
ALT: 20 U/L (ref 9–46)
AST: 19 U/L (ref 10–40)
Alkaline Phosphatase: 58 U/L (ref 40–115)
BUN: 9 mg/dL (ref 7–25)
CALCIUM: 8.9 mg/dL (ref 8.6–10.3)
CHLORIDE: 105 mmol/L (ref 98–110)
CO2: 24 mmol/L (ref 20–31)
Creat: 0.82 mg/dL (ref 0.60–1.35)
Glucose, Bld: 121 mg/dL — ABNORMAL HIGH (ref 65–99)
Potassium: 3.9 mmol/L (ref 3.5–5.3)
SODIUM: 138 mmol/L (ref 135–146)
Total Bilirubin: 0.5 mg/dL (ref 0.2–1.2)
Total Protein: 6.5 g/dL (ref 6.1–8.1)

## 2015-02-12 LAB — HEMOGLOBIN A1C
Hgb A1c MFr Bld: 5.5 % (ref <5.7)
Mean Plasma Glucose: 111 mg/dL (ref <117)

## 2015-02-12 MED ORDER — MECLIZINE HCL 12.5 MG PO TABS: 12.5000 mg | ORAL_TABLET | Freq: Three times a day (TID) | ORAL | Status: DC | PRN

## 2015-02-12 MED ORDER — CEFDINIR 300 MG PO CAPS: 300.0000 mg | ORAL_CAPSULE | Freq: Two times a day (BID) | ORAL | Status: DC

## 2015-02-12 NOTE — Patient Instructions (Addendum)
Your dizziness/vertigo is mild now compared to earlier today with good/normal neuruologic examen absent of worrisome symptoms/signs presently.  You do have moderate bright red left tm that may represent early OM. Ear infection could effect balance. I am going to rx cefdnir antibiotic.  For your dizziness and vertigo I will rx meclizine.   Will do some  work up today for dizziness both cmp and cbc. Finger stick bs in office. No indication for imaging studies but if your symptoms change or worsen as discussed then ED evaluation.  Recommend not doing heavy work or driving if dizzy. If you need to do supervision/not manual  type work that should be ok.  Follow up 7 days with Dr. Drue Novel or as needed.  Will get a1c today as well.

## 2015-02-12 NOTE — Progress Notes (Signed)
Pre visit review using our clinic review tool, if applicable. No additional management support is needed unless otherwise documented below in the visit note. 

## 2015-02-12 NOTE — Addendum Note (Signed)
Addended by: Neldon Labella on: 02/12/2015 03:59 PM   Modules accepted: Orders

## 2015-02-12 NOTE — Progress Notes (Signed)
Subjective:    Patient ID: Zachary Blake, male    DOB: [birth year], 44 y.o.   MRN: [id number]  HPI   Pt in states he is dizzy. Pt stated today. Pt never had before per pt. No uri systems on review. No fever or chills. Started earlier today. This morning was severe started around 10 am. He felt light headed and felt like room spinning. That last about 45 minutes.  Then subsided.He still feels light headed but no spinning. No trauma or head injury. No ha. Pt states he ate has brown at [employer]. Then states he was pouring Chlorox down sink. But no excessive fumes. No vision changes. No gross motor or sensory function deficits.  Pt blood pressure looks good today and good in past as well.   Review of Systems  Constitutional: Negative for fever, chills, diaphoresis, activity change and fatigue.  Respiratory: Negative for cough, chest tightness and shortness of breath.   Cardiovascular: Negative for chest pain, palpitations and leg swelling.  Gastrointestinal: Negative for nausea, vomiting and abdominal pain.  Musculoskeletal: Negative for neck pain and neck stiffness.  Neurological: Negative for dizziness, tremors, seizures, syncope, facial asymmetry, speech difficulty, weakness, light-headedness, numbness and headaches.  Psychiatric/Behavioral: Negative for behavioral problems, confusion and agitation. The patient is not nervous/anxious.     Past Medical History  Diagnosis Date  . Elevated BP as a child  . Warts, genital     Social History   Social History  . Marital Status: Single    Spouse Name: N/A  . Number of Children: 2  . Years of Education: N/A   Occupational History  . professional Advertising account planner , doors, etc    Social History Main Topics  . Smoking status: Light Tobacco Smoker  . Smokeless tobacco: Never Used     Comment: occasionally  . Alcohol Use: Yes     Comment: occasionally  . Drug Use: No  . Sexual Activity: Yes   Other Topics Concern  . Not on file    Social History Narrative   2 children live w/  him    Past Surgical History  Procedure Laterality Date  . 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 Hand surgery Right     fracture    Family History  Problem Relation Age of Onset  . CAD Neg Hx   . Diabetes Neg Hx   . Colon cancer Neg Hx   . Prostate cancer Neg Hx     No Known Allergies  No current outpatient prescriptions on file prior to visit.   No current facility-administered medications on file prior to visit.    BP 120/70 mmHg  Pulse 71  Temp(Src) 97.7 F (36.5 C) (Oral)  Ht  (1.753 m)  Wt 204 lb 3.2 oz (92.625 kg)  BMI 30.14 kg/m2  SpO2 98%       Objective:   Physical Exam  General Mental Status- Alert. General Appearance- Not in acute distress.   Skin General: Color- Normal Color. Moisture- Normal Moisture.  Neck Carotid Arteries- Normal color. Moisture- Normal Moisture. No carotid bruits. No JVD.  Chest and Lung Exam Auscultation: Breath Sounds:-Normal.  Cardiovascular Auscultation:Rythm- Regular. Murmurs & Other Heart Sounds:Auscultation of the heart reveals- No Murmurs.  Abdomen Inspection:-Inspeection Normal. Palpation/Percussion:Note:No mass. Palpation and Percussion of the abdomen reveal- Non Tender, Non Distended + BS, no rebound or guarding.   HEENT Head- Normal. Ear Auditory Canal -  Left- Normal. Right - Normal.Tympanic Membrane- Left- Normal. Right- Normal. Eye Sclera/Conjunctiva- Left- Moderate bright red tm.  Right- Normal. Nose & Sinuses Nasal Mucosa- Left-   Not Boggy and not Congested. Right-  Not  Boggy and  Not Congested.Bilateral maxillary and frontal sinus pressure. Mouth & Throat Lips: Upper Lip- Normal: no dryness, cracking, pallor, cyanosis, or vesicular eruption. Lower Lip-Normal: no dryness, cracking, pallor, cyanosis or vesicular eruption. Buccal Mucosa- Bilateral- No Aphthous  ulcers. Oropharynx- No Discharge or Erythema. Tonsils: Characteristics- Bilateral- No Erythema or Congestion. Size/Enlargement- Bilateral- No enlargement. Discharge- bilateral-None.    Neurologic Cranial Nerve exam:- CN III-XII intact(No nystagmus), symmetric smile. Drift Test:- No drift. Romberg Exam:- Negative.  Heal to Toe Gait exam:-Normal. Finger to Nose:- Normal/Intact Strength:- 5/5 equal and symmetric strength both upper and lower extremities.      Assessment & Plan:  Your dizziness/vertigo is mild now compared to earlier today with good/normal neuruologic examen absent of worrisome symptoms presently.  You do have moderate bright red left tm that may represent early OM. Ear infection could effect balance. I am going to rx cefdnir antibiotic.  For your dizziness and vertigo I will rx meclizine.   Will do some  work up today for dizziness both cmp and cbc. Finger stick bs in office. No indication for imaging studies but if your symptoms change or worsen as discussed then ED evaluation.  Recommend not doing heavy work or driving if dizzy. If you need to do supervision/not manual  type work that should be ok.  Follow up 7 days with Dr. Drue Novel or as needed.

## 2015-02-15 ENCOUNTER — Telehealth: Payer: Self-pay | Admitting: Internal Medicine

## 2015-02-15 NOTE — Telephone Encounter (Signed)
Caller name: Konrad Hoak Relation to pt: Self Call back number:[phone] Pharmacy:  Reason for call: Pt was returning call for lab results. Please advise.

## 2015-02-16 NOTE — Telephone Encounter (Signed)
Spoke with pt and he voices understanding. Pt will call back to schedule an appt.

## 2015-06-18 ENCOUNTER — Telehealth: Payer: Self-pay | Admitting: Internal Medicine

## 2015-06-18 NOTE — Telephone Encounter (Signed)
LM to schedule CPE (new pt was 09/2013) and update info on flu shot

## 2020-12-02 ENCOUNTER — Ambulatory Visit: Admission: EM | Admit: 2020-12-02 | Discharge: 2020-12-02 | Discharge status: Against Medical Advice | Payer: Self-pay

## 2020-12-02 ENCOUNTER — Other Ambulatory Visit: Payer: Self-pay

## 2020-12-02 NOTE — ED Triage Notes (Signed)
Onset this morning of left eye swelling and pain. Confirms discharge. Denies any visual decrease, but notes some photophobia. No right eye sxs. Has been taking tylenol without relief. No uri sxs.

## 2020-12-02 NOTE — ED Notes (Signed)
States he had to leave to go pick up daughter

## 2022-08-14 ENCOUNTER — Ambulatory Visit (INDEPENDENT_AMBULATORY_CARE_PROVIDER_SITE_OTHER): Payer: Commercial Managed Care - PPO | Admitting: Nurse Practitioner

## 2022-08-14 ENCOUNTER — Encounter: Payer: Self-pay | Admitting: Nurse Practitioner

## 2022-08-14 VITALS — BP 134/82 | HR 82 | Temp 98.0°F | Ht 69.0 in | Wt 205.0 lb

## 2022-08-14 DIAGNOSIS — Z23 Encounter for immunization: Secondary | ICD-10-CM | POA: Diagnosis not present

## 2022-08-14 DIAGNOSIS — Z131 Encounter for screening for diabetes mellitus: Secondary | ICD-10-CM

## 2022-08-14 DIAGNOSIS — Z1211 Encounter for screening for malignant neoplasm of colon: Secondary | ICD-10-CM

## 2022-08-14 DIAGNOSIS — Z136 Encounter for screening for cardiovascular disorders: Secondary | ICD-10-CM

## 2022-08-14 DIAGNOSIS — Z0000 Encounter for general adult medical examination without abnormal findings: Secondary | ICD-10-CM

## 2022-08-14 DIAGNOSIS — R030 Elevated blood-pressure reading, without diagnosis of hypertension: Secondary | ICD-10-CM

## 2022-08-14 NOTE — Patient Instructions (Signed)
It was great to see you!  We are checking your labs today and will let you know the results via mychart/phone.   Keep up the great work!   Let's follow-up in 1 year, sooner if you have concerns.  If a referral was placed today, you will be contacted for an appointment. Please note that routine referrals can sometimes take up to 3-4 weeks to process. Please call our office if you haven't heard anything after this time frame.  Take care,  Alastair Hennes, NP  

## 2022-08-14 NOTE — Progress Notes (Unsigned)
New Patient Visit  BP 134/82 (BP Location: Right Arm)   Pulse 82   Temp 98 F (36.7 C)   Ht 5\' 9"  (1.753 m)   Wt 205 lb (93 kg)   SpO2 96%   BMI 30.27 kg/m    Subjective:    Patient ID: Zachary Blake, male    DOB: 22-Dec-1970, 52 y.o.   MRN: [id number]  CC: Chief Complaint  Patient presents with   Establish Care    NP. Est. Care, no concerns    HPI: Zachary Blake is a 52 y.o. male presents for new patient visit to establish care.  Introduced to Designer, jewellery role and practice setting.  All questions answered.  Discussed provider/patient relationship and expectations.  He has a history of elevated blood pressure. He has been trying to control it with diet and exercise. He also stopped drinking 4 months ago. He denies chest pain, shortness of breath, and headaches.   Depression and Anxiety Screen done:     08/14/2022    3:36 PM 02/12/2015    3:04 PM  Depression screen PHQ 2/9  Decreased Interest 0 0  Down, Depressed, Hopeless 0 0  PHQ - 2 Score 0 0  Altered sleeping 0   Tired, decreased energy 1   Change in appetite 0   Feeling bad or failure about yourself  0   Trouble concentrating 0   Moving slowly or fidgety/restless 0   Suicidal thoughts 0   PHQ-9 Score 1   Difficult doing work/chores Not difficult at all       08/14/2022    3:36 PM  GAD 7 : Generalized Anxiety Score  Nervous, Anxious, on Edge 2  Control/stop worrying 3  Worry too much - different things 3  Trouble relaxing 0  Restless 0  Easily annoyed or irritable 1  Afraid - awful might happen 0  Total GAD 7 Score 9  Anxiety Difficulty Not difficult at all    Past Medical History:  Diagnosis Date   Elevated BP as a child   Warts, genital     Past Surgical History:  Procedure Laterality Date   HAND SURGERY Right    fracture   SURGERY SCROTAL / TESTICULAR Left    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   No current facility-administered medications on file prior to visit.     Review of Systems  Constitutional:  Positive for fatigue. Negative for fever.  HENT: Negative.    Eyes: Negative.   Respiratory: Negative.    Cardiovascular: Negative.   Gastrointestinal: Negative.   Genitourinary: Negative.   Musculoskeletal: Negative.   Skin: Negative.   Neurological: Negative.   Psychiatric/Behavioral:         Stress      Objective:    BP 134/82 (BP  Location: Right Arm)   Pulse 82   Temp 98 F (36.7 C)   Ht 5\' 9"  (1.753 m)   Wt 205 lb (93 kg)   SpO2 96%   BMI 30.27 kg/m   Wt Readings from Last 3 Encounters:  08/14/22 205 lb (93 kg)  02/12/15 204 lb 3.2 oz (92.6 kg)  10/01/13 188 lb (85.3 kg)    BP Readings from Last 3 Encounters:  08/14/22 134/82  12/02/20 (!) 145/93  02/12/15 120/70    Physical Exam Vitals and nursing note reviewed.  Constitutional:      General: He is not in acute distress.    Appearance: Normal appearance.  HENT:     Head: Normocephalic and atraumatic.     Right Ear: Tympanic membrane, ear canal and external ear normal.     Left Ear: Tympanic membrane, ear canal and external ear normal.  Eyes:     Conjunctiva/sclera: Conjunctivae normal.  Cardiovascular:     Rate and Rhythm: Normal rate and regular rhythm.     Pulses: Normal pulses.     Heart sounds: Normal heart sounds.  Pulmonary:     Effort: Pulmonary effort is normal.     Breath sounds: Normal breath sounds.  Abdominal:     Palpations: Abdomen is  soft.     Tenderness: There is no abdominal tenderness.  Musculoskeletal:        General: Normal range of motion.     Cervical back: Normal range of motion and neck supple. No tenderness.     Right lower leg: No edema.     Left lower leg: No edema.  Lymphadenopathy:     Cervical: No cervical adenopathy.  Skin:    General: Skin is warm and dry.  Neurological:     General: No focal deficit present.     Mental Status: He is alert and oriented to person, place, and time.     Cranial Nerves: No cranial nerve deficit.     Coordination: Coordination normal.     Gait: Gait normal.  Psychiatric:        Mood and Affect: Mood normal.        Behavior: Behavior normal.        Thought Content: Thought content normal.        Judgment: Judgment normal.       Assessment & Plan:   Problem List Items Addressed This Visit       Other   Elevated blood pressure reading    Blood pressure today 134/82. Continue limiting salt in diet and increasing exercise as able.       Routine general medical examination at a health care facility - Primary    Health maintenance reviewed and updated. Discussed nutrition, exercise. Check CMP, CBC, TSH today. Follow-up 1 year.        Relevant Orders   CBC with Differential/Platelet   Comprehensive metabolic panel   TSH   Other Visit Diagnoses     Screen for colon cancer       Discussed colon cancer screening options and he would like to do the Cologuard. Cologuard ordered today.   Relevant Orders   Cologuard   Immunization due       Td booster given today   Relevant Orders   Td vaccine greater than or equal to 7yo preservative free IM (Completed)   Screening for cardiovascular condition       Screen lipid panel today   Relevant Orders   Lipid panel  Screening for diabetes mellitus       Check A1c today   Relevant Orders   Hemoglobin A1c        Follow up plan: Return in about 1 year (around 08/14/2023) for CPE.

## 2022-08-15 DIAGNOSIS — Z0000 Encounter for general adult medical examination without abnormal findings: Secondary | ICD-10-CM | POA: Insufficient documentation

## 2022-08-15 LAB — CBC WITH DIFFERENTIAL/PLATELET
Basophils Absolute: 0.1 10*3/uL (ref 0.0–0.1)
Basophils Relative: 1.2 % (ref 0.0–3.0)
Eosinophils Absolute: 0.2 10*3/uL (ref 0.0–0.7)
Eosinophils Relative: 1.9 % (ref 0.0–5.0)
HCT: 44 % (ref 39.0–52.0)
Hemoglobin: 14.9 g/dL (ref 13.0–17.0)
Lymphocytes Relative: 48.6 % — ABNORMAL HIGH (ref 12.0–46.0)
Lymphs Abs: 5.1 10*3/uL — ABNORMAL HIGH (ref 0.7–4.0)
MCHC: 33.9 g/dL (ref 30.0–36.0)
MCV: 80.5 fl (ref 78.0–100.0)
Monocytes Absolute: 0.7 10*3/uL (ref 0.1–1.0)
Monocytes Relative: 6.4 % (ref 3.0–12.0)
Neutro Abs: 4.4 10*3/uL (ref 1.4–7.7)
Neutrophils Relative %: 41.9 % — ABNORMAL LOW (ref 43.0–77.0)
Platelets: 254 10*3/uL (ref 150.0–400.0)
RBC: 5.46 Mil/uL (ref 4.22–5.81)
RDW: 14.6 % (ref 11.5–15.5)
WBC: 10.5 10*3/uL (ref 4.0–10.5)

## 2022-08-15 LAB — COMPREHENSIVE METABOLIC PANEL
ALT: 23 U/L (ref 0–53)
AST: 21 U/L (ref 0–37)
Albumin: 4.4 g/dL (ref 3.5–5.2)
Alkaline Phosphatase: 60 U/L (ref 39–117)
BUN: 12 mg/dL (ref 6–23)
CO2: 27 mEq/L (ref 19–32)
Calcium: 9.6 mg/dL (ref 8.4–10.5)
Chloride: 106 mEq/L (ref 96–112)
Creatinine, Ser: 0.95 mg/dL (ref 0.40–1.50)
GFR: 93.69 mL/min (ref >60.00)
Glucose, Bld: 72 mg/dL (ref 70–99)
Potassium: 4.1 mEq/L (ref 3.5–5.1)
Sodium: 141 mEq/L (ref 135–145)
Total Bilirubin: 0.4 mg/dL (ref 0.2–1.2)
Total Protein: 7.2 g/dL (ref 6.0–8.3)

## 2022-08-15 LAB — LIPID PANEL
Cholesterol: 193 mg/dL (ref 0–200)
HDL: 36.8 mg/dL — ABNORMAL LOW (ref >39.00)
LDL Cholesterol: 135 mg/dL — ABNORMAL HIGH (ref 0–99)
NonHDL: 156.12
Total CHOL/HDL Ratio: 5
Triglycerides: 106 mg/dL (ref 0.0–149.0)
VLDL: 21.2 mg/dL (ref 0.0–40.0)

## 2022-08-15 LAB — HEMOGLOBIN A1C: Hgb A1c MFr Bld: 6.1 % (ref 4.6–6.5)

## 2022-08-15 LAB — TSH: TSH: 1.53 u[IU]/mL (ref 0.35–5.50)

## 2022-08-15 NOTE — Assessment & Plan Note (Signed)
Health maintenance reviewed and updated. Discussed nutrition, exercise. Check CMP, CBC, TSH today. Follow-up 1 year.   

## 2022-08-15 NOTE — Assessment & Plan Note (Signed)
Blood pressure today 134/82. Continue limiting salt in diet and increasing exercise as able.

## 2022-08-17 ENCOUNTER — Telehealth: Payer: Self-pay | Admitting: Nurse Practitioner

## 2022-08-17 NOTE — Telephone Encounter (Signed)
Patient notified of test results and  month appointment made.

## 2022-08-17 NOTE — Telephone Encounter (Signed)
Pt called and stated that he is returning your call from 3.27.24 please give him a call back

## 2023-02-01 ENCOUNTER — Ambulatory Visit: Payer: Commercial Managed Care - PPO | Admitting: Nurse Practitioner

## 2023-02-01 ENCOUNTER — Telehealth: Payer: Self-pay | Admitting: Nurse Practitioner

## 2023-02-01 NOTE — Telephone Encounter (Signed)
Pt was a no show for an OV with Lauren on 02/01/23, I sent a letter.

## 2023-02-02 NOTE — Telephone Encounter (Signed)
Noted  

## 2023-02-02 NOTE — Telephone Encounter (Signed)
1st no show, letter sent via mail

## 2023-02-23 ENCOUNTER — Other Ambulatory Visit: Payer: Self-pay | Admitting: Nurse Practitioner

## 2023-02-23 DIAGNOSIS — Z1211 Encounter for screening for malignant neoplasm of colon: Secondary | ICD-10-CM

## 2023-02-23 DIAGNOSIS — Z1212 Encounter for screening for malignant neoplasm of rectum: Secondary | ICD-10-CM

## 2023-05-21 ENCOUNTER — Ambulatory Visit: Payer: Self-pay | Admitting: Nurse Practitioner

## 2023-05-21 NOTE — Telephone Encounter (Signed)
Noted, agree with urgent care since no appointments available.

## 2023-05-21 NOTE — Telephone Encounter (Signed)
Copied from CRM [id number]. Topic: Clinical - Red Word Triage >> May 21, 2023  2:07 PM Alcus Dad H wrote: Red Word that prompted transfer to Nurse Triage: severe abdominal pain, diarrhea, vomiting, low grade fever since this morning    Chief Complaint: abd pain Symptoms: nausea, vomiting, and diarrhea Frequency: intermittent Pertinent Negatives: Patient denies pain with urination Disposition: [] ED /[x] Urgent Care (no appt availability in office) / [] Appointment(In office/virtual)/ []  [facility]/ [] Home Care/ [] Refused Recommended Disposition /[] [facility]/ []  Follow-up with PCP Additional Notes: spoke with patient's mother, sts that patient has had abdominal pain with nausea, vomiting,and diarrhea x 4-5 hours. Started gradually this morning. Pt sts that he ate some pie that he never had before last night. Pain better with laying down, worse with movement. No available appts today, pt encouraged to go to ED.     Reason for Disposition  [1] SEVERE pain AND [2] present < 1 hour  Answer Assessment - Initial Assessment Questions 1. LOCATION: "Where does it hurt?"    Lower belly  2. RADIATION: "Does the pain shoot anywhere else?" (e.g., chest, back)     No  3. ONSET: "When did the pain begin?" (Minutes, hours or days ago)      Approx 4 hours ago  4. SUDDEN: "Gradual or sudden onset?"     Gradual onset this morning  5. PATTERN "Does the pain come and go, or is it constant?"    - If it comes and goes: "How long does it last?" "Do you have pain now?"     (Note: Comes and goes means the pain is intermittent. It goes away completely between bouts.)    - If constant: "Is it getting better, staying the same, or getting worse?"      (Note: Constant means the pain never goes away completely; most serious pain is constant and gets worse.)      Comes and goes, pain usually last 5-10 minutes, gets better when he lays  6. SEVERITY: "How bad is the pain?"  (e.g., Scale 1-10;  mild, moderate, or severe)    - MILD (1-3): Doesn't interfere with normal activities, abdomen soft and not tender to touch.     - MODERATE (4-7): Interferes with normal activities or awakens from sleep, abdomen tender to touch.     - SEVERE (8-10): Excruciating pain, doubled over, unable to do any normal activities.       10- severe  7. RECURRENT SYMPTOM: "Have you ever had this type of stomach pain before?" If Yes, ask: "When was the last time?" and "What happened that time?"      Not a recurrent symptoms  8. CAUSE: "What do you think is causing the stomach pain?"     Unsure, but he did eat pie that he never had before  9. RELIEVING/AGGRAVATING FACTORS: "What makes it better or worse?" (e.g., antacids, bending or twisting motion, bowel movement)     Laying down makes pain better, moving make it worse  10. OTHER SYMPTOMS: "Do you have any other symptoms?" (e.g., back pain, diarrhea, fever, urination pain, vomiting)       Diarrhea, fever, vomiting  Protocols used: Abdominal Pain - Male-A-AH

## 2023-11-15 ENCOUNTER — Telehealth: Payer: Self-pay

## 2023-11-15 DIAGNOSIS — Z111 Encounter for screening for respiratory tuberculosis: Secondary | ICD-10-CM

## 2023-11-15 NOTE — Telephone Encounter (Signed)
 Copied from CRM [id number]. Topic: Clinical - Medical Advice >> Nov 15, 2023  3:45 PM Burnard DEL wrote: Reason for CRM: Patients mom called in stating that he received a td vaccine in March and his job is asking about a result from the vaccine.However she is stating is not suppose to be a result from the vaccine,correct? >> Nov 15, 2023  4:21 PM Viola F wrote: Patient called wanting to know if he had a tb skin test done before? Please call him to let him know. I could not find any tb shot in chart.

## 2023-11-15 NOTE — Telephone Encounter (Signed)
 Left message for patient to return call.

## 2023-11-16 NOTE — Telephone Encounter (Signed)
 I returned patient's call and he questioned when his last TB skin test was. I told patient there is no document in his chart that this was ever done. Patient was scheduled for 11/21/23 for a nurse visit to have a PPD. I told patient that he would need to come in a day sooner since the office is closed on 11/23/23.

## 2023-11-16 NOTE — Telephone Encounter (Signed)
 Order placed

## 2023-11-19 NOTE — Progress Notes (Unsigned)
 PPD Placement note Zachary Blake, 53 y.o. male is here today for placement of PPD test Reason for PPD test: Screening  Pt taken PPD test before: yes Verified in allergy area and with patient that they are not allergic to the products PPD is made of (Phenol or Tween). Yes Is patient taking any oral or IV steroid medication now or have they taken it in the last month? no Has the patient ever received the BCG vaccine?: no Has the patient been in recent contact with anyone known or suspected of having active TB disease?: no      Date of exposure (if applicable): N/A      Name of person they were exposed to (if applicable): N/A Patient's Country of origin?: N/A O: Alert and oriented in NAD. P:  PPD placed on 11/19/2023.  Patient advised to return for reading within 48-72 hours.   PPD placed in the right forearm at 2:58 PM. Patient tolerated well.

## 2023-11-20 ENCOUNTER — Ambulatory Visit (INDEPENDENT_AMBULATORY_CARE_PROVIDER_SITE_OTHER)

## 2023-11-20 VITALS — Ht 69.0 in | Wt 208.4 lb

## 2023-11-20 DIAGNOSIS — Z111 Encounter for screening for respiratory tuberculosis: Secondary | ICD-10-CM

## 2023-11-21 ENCOUNTER — Ambulatory Visit

## 2023-11-22 ENCOUNTER — Ambulatory Visit

## 2023-11-22 ENCOUNTER — Ambulatory Visit: Payer: Self-pay | Admitting: Nurse Practitioner

## 2023-11-22 LAB — TB SKIN TEST
Induration: 0 mm
TB Skin Test: NEGATIVE

## 2023-11-22 NOTE — Progress Notes (Signed)
 PPD Reading Note PPD read and results entered in Epic[employer]. Result: 0 mm induration. Interpretation: Negative If test not read within 48-72 hours of initial placement, patient advised to repeat in other arm 1-3 weeks after this test. Allergic reaction: no  Test read by Greer Norse, CMA and Dr. Elsie Lent.  Form completed, sign and original copy given to pt.
